# Patient Record
Sex: Male | Born: 1965 | Race: White | Hispanic: No | Marital: Single | Smoking: Current every day smoker
Health system: Southern US, Community
[De-identification: ages and names within clinical notes are randomized; demographics above are authoritative.]

## PROBLEM LIST (undated history)

## (undated) DIAGNOSIS — I10 Essential (primary) hypertension: Secondary | ICD-10-CM

## (undated) HISTORY — PX: HAND SURGERY: SHX662

---

## 2007-02-26 ENCOUNTER — Inpatient Hospital Stay (HOSPITAL_COMMUNITY): Admission: EM | Admit: 2007-02-26 | Discharge: 2007-02-27 | Payer: Self-pay | Admitting: Emergency Medicine

## 2007-02-26 ENCOUNTER — Ambulatory Visit: Payer: Self-pay | Admitting: Cardiology

## 2007-02-26 ENCOUNTER — Ambulatory Visit: Payer: Self-pay | Admitting: Internal Medicine

## 2008-03-22 IMAGING — CR DG CHEST 1V PORT
1 series · 1 of 1 positions shown · non-contrast
Comparison: None available.

CLINICAL DATA: 41-year-old with chest pain for three days.  
 PORTABLE CHEST - 1 VIEW 02/26/07:

[view not recorded]
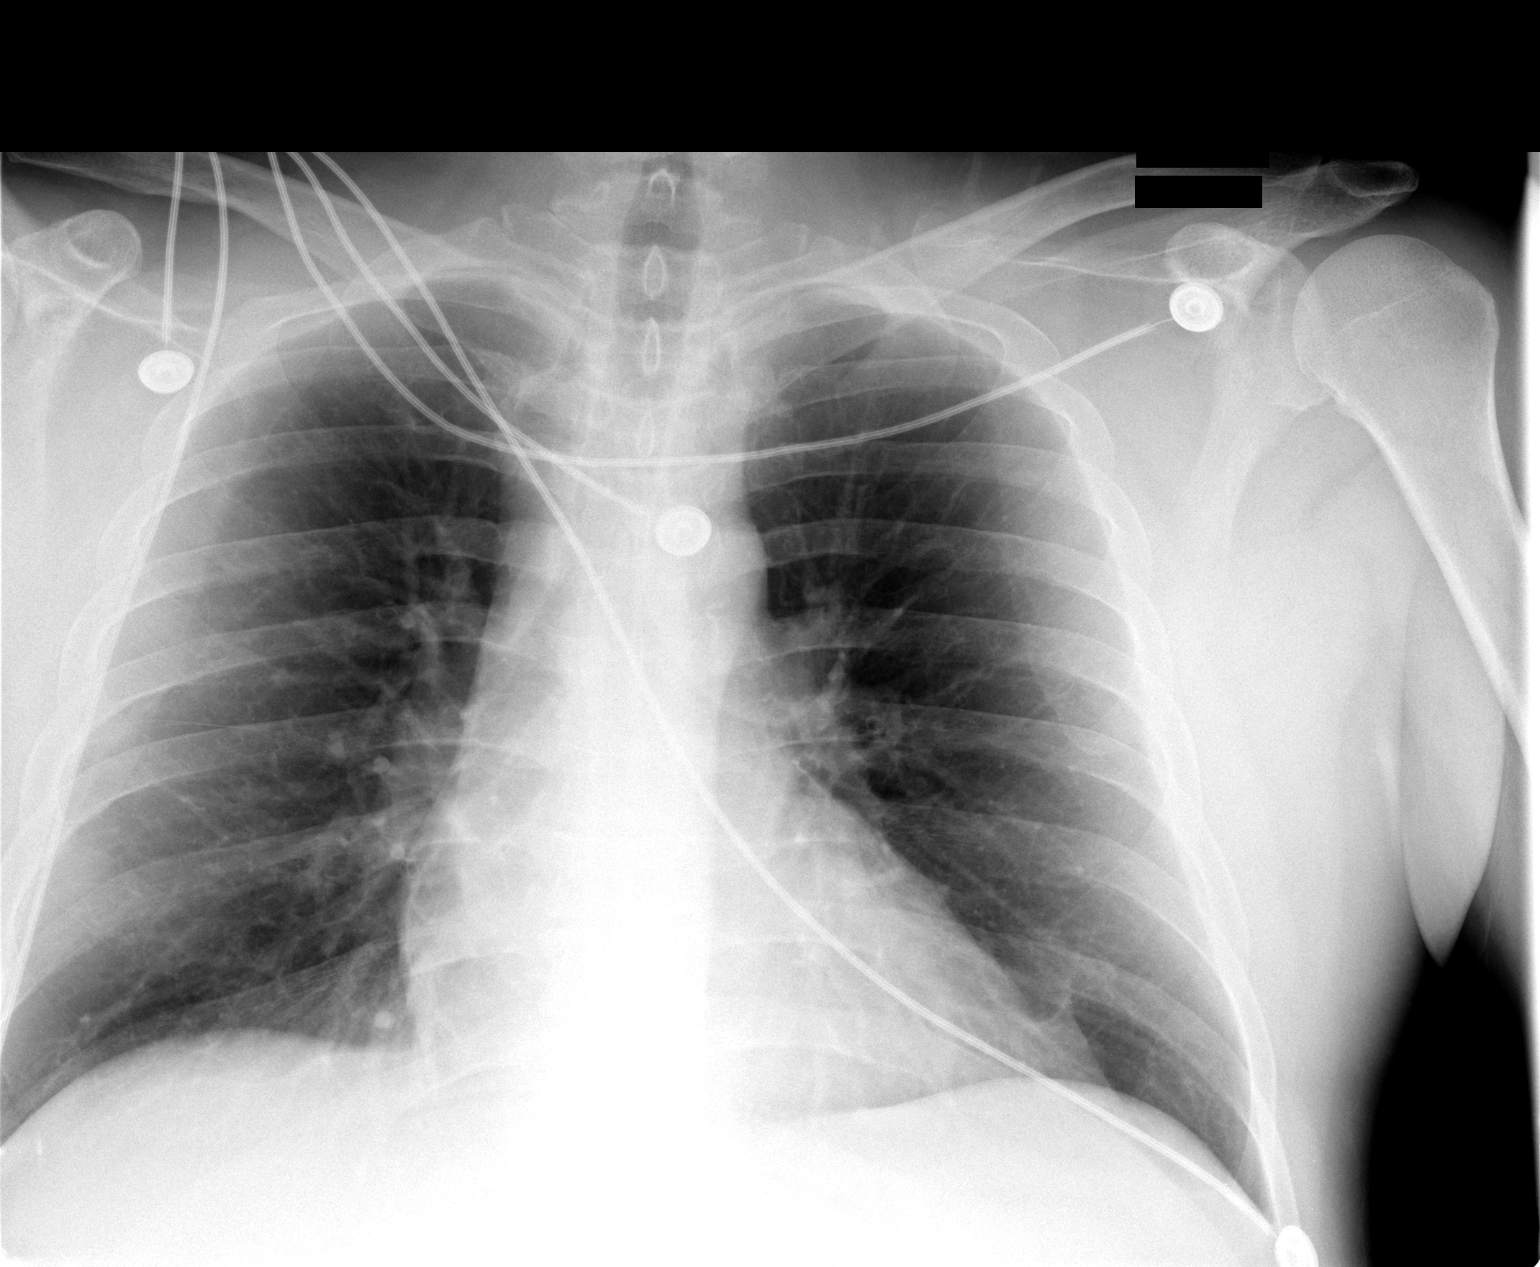

[1 of 1 positions shown; findings below may reference images not displayed]

FINDINGS: Heart size is normal.  Lungs are free of focal consolidations and pleural effusions.  There is minimal left base atelectasis.
IMPRESSION: No evidence for acute abnormality.

## 2010-03-05 ENCOUNTER — Ambulatory Visit (HOSPITAL_COMMUNITY): Admission: RE | Admit: 2010-03-05 | Discharge: 2010-03-05 | Payer: Self-pay | Admitting: Orthopedic Surgery

## 2010-10-02 LAB — DIFFERENTIAL
Basophils Relative: 0 % (ref 0–1)
Eosinophils Relative: 1 % (ref 0–5)
Monocytes Relative: 9 % (ref 3–12)
Neutro Abs: 3.6 10*3/uL (ref 1.7–7.7)
Neutrophils Relative %: 55 % (ref 43–77)

## 2010-10-02 LAB — COMPREHENSIVE METABOLIC PANEL
ALT: 66 U/L — ABNORMAL HIGH (ref 0–53)
AST: 84 U/L — ABNORMAL HIGH (ref 0–37)
Alkaline Phosphatase: 57 U/L (ref 39–117)
BUN: 7 mg/dL (ref 6–23)
Calcium: 9.2 mg/dL (ref 8.4–10.5)
Creatinine, Ser: 0.92 mg/dL (ref 0.4–1.5)
GFR calc Af Amer: >60 mL/min (ref >60)
GFR calc non Af Amer: >60 mL/min (ref >60)

## 2010-10-02 LAB — CBC
Hemoglobin: 14.9 g/dL (ref 13.0–17.0)
MCH: 32.1 pg (ref 26.0–34.0)
MCHC: 35.5 g/dL (ref 30.0–36.0)
RBC: 4.63 MIL/uL (ref 4.22–5.81)

## 2010-10-02 LAB — SURGICAL PCR SCREEN: Staphylococcus aureus: POSITIVE — AB

## 2010-12-01 NOTE — Consult Note (Signed)
NAME:  Gavin Martinez, Gavin Martinez                  ACCOUNT NO.:  [id number]   MEDICAL RECORD NO.:  [id number]          PATIENT TYPE:  INP   LOCATION:  1443                         FACILITY:  [facility]   PHYSICIAN:  Gavin Frieze. Jens Som, MD, FACCDATE OF BIRTH:  07-Oct-1965   DATE OF CONSULTATION:  02/27/2007  DATE OF DISCHARGE:                                 CONSULTATION   REASON FOR CONSULTATION:  Gavin Martinez is a 45 year old gentleman with no  significant past medical history who we are asked to evaluate for chest  pain.  The patient typically does not have dyspnea on exertion,  orthopnea, PND, pedal edema, palpitations, presyncope, syncope or  exertional chest pain.  He does have a significant tobacco and alcohol  history.  Thursday evening, he was playing cards.  He states he was  smoking quite a bit and drinking.  Friday morning, he woke and had a  pain in his chest.  The pain did not radiate.  There was no associated  nausea, vomiting, shortness of breath or diaphoresis.  The pain  increased with certain movements and also with deep inspiration.  His  pain persisted over the past 3 days and increased yesterday.  He was  admitted for rule out myocardial function by the primary service and  cardiology was asked to further evaluate.   MEDICATIONS:  1. Enoxaparin 40 mg subcutaneous daily.  2. Aspirin 81 mg daily.  3. Protonix 40 mg daily.  4. Nicotine patch.   ALLERGIES:  No known drug allergies.   FAMILY HISTORY:  Unknown as he is adopted.   SOCIAL HISTORY:  He smokes one pack of cigarettes per day.  He consumes  significant amounts of alcohol including 12 beers per day on the  weekends by his report.  He denies any cocaine use.   PAST MEDICAL HISTORY:  There is no diabetes mellitus, hypertension or  hyperlipidemia.  He has had no previous surgeries.   REVIEW OF SYSTEMS:  He denies any headaches or fevers or chills.  There  is no productive cough or hemoptysis.  There is no dysphagia,  odynophagia, melena or hematochezia.  There is no dysuria or hematuria.  There is no rash or seizure activity.  There is no orthopnea, PND or  pedal edema.  There is no claudication noted.  The remaining systems are  negative.   PHYSICAL EXAMINATION:  VITAL SIGNS:  Today, exam shows a blood pressure  of 136/99.  His pulse is 63.  He is afebrile.  He is 100% on room air.  GENERAL:  He is well-developed, well-nourished in no acute distress.  SKIN:  Warm and dry.  There is no peripheral clubbing.  BACK:  Normal.  HEENT:  Normal with normal eyelids.  NECK:  Supple with a normal upstroke bilaterally.  No bruits heard.  There is no jugular distension and I cannot appreciate thyromegaly.  CHEST:  Chest is clear to auscultation on expansion.  CARDIAC:  Regular rate and rhythm.  Normal S1, S2.  There are no  murmurs, rubs or gallops.  There is no tenderness to palpation  in the  chest area.  ABDOMEN:  Nontender and positive bowel sounds, no hepatosplenomegaly.  No masses appreciated.  There is no abdominal bruit.  He has 2+ femoral  pulses bilaterally.  No bruits.  EXTREMITIES:  No edema.  I could palpate no cords.  He has 2+ posterior  tibial pulses bilaterally.  NEUROLOGIC:  Grossly intact.   LABORATORY DATA AND X-RAY FINDINGS:  Electrocardiogram shows a sinus  rhythm at a rate of 71.  The axis is normal.  There are no ST changes  noted.  His chest x-ray shows no acute disease.   Total cholesterol 220 with triglyceride level of 417 and HDL 26.  Cardiac markers are negative.  His TSH is normal.  His SGOT is mildly  elevated at 50.  A D-dimer is normal.  His hemoglobin is 14.3.   IMPRESSION/RECOMMENDATIONS:  1. Atypical chest pain.  His symptoms are most consistent with      musculoskeletal pain.  He is ruled out for myocardial infarction      and serial enzymes are negative.  He can be discharged from a      cardiac standpoint if his echocardiogram is normal and I will      schedule an  exercise treadmill for risk stratification as he does      have risk factors.  2. Tobacco alcohol abuse.  I discussed the importance of discontinuing      this.  3. Elevated blood pressure.  We need to monitor his blood pressure and      if it remains elevated, he will require therapy.  4. Hyperlipidemia.  He needs therapy for his abnormal cholesterol and      I will leave this to the primary care physicians.  5. Increased SGOT.  Most likely secondary to his alcohol abuse.   Please call if there are any questions.      Gavin Frieze Jens Som, MD, [facility]  Electronically Signed     BSC/MEDQ  D:  02/27/2007  T:  02/28/2007  Job:  [id number]

## 2010-12-01 NOTE — H&P (Signed)
NAME:  Gavin Martinez, Gavin Martinez                  ACCOUNT NO.:  [id number]   MEDICAL RECORD NO.:  [id number]          PATIENT TYPE:  EMS   LOCATION:  ED                           FACILITY:  [facility]   PHYSICIAN:  Ladell Pier, M.D.   DATE OF BIRTH:  1966/03/07   DATE OF ADMISSION:  02/26/2007  DATE OF DISCHARGE:                              HISTORY & PHYSICAL   CHIEF COMPLAINT:  Chest pain.   HISTORY OF PRESENT ILLNESS:  The patient is a 45 year old white male  that presented to the emergency department with complaints of chest pain  for the past 3 days.  Pain is in the left side of the chest and radiates  across the chest to the left arm.  He also complains of the pain being  more like a pressure sensation, about a 7/10.  He also states that it  hurts to take a deep breath and when he coughs.  He has had no  respiratory symptoms.  He woke up this morning with the pain.  It has  been going on for the past 3 days.  He has had no unusual physical  activities.  The pain is not related to fluid.  He has no nausea,  vomiting.  No history of reflux.   PAST MEDICAL HISTORY:  None.   FAMILY HISTORY:  Unknown.  He is adopted.   SOCIAL HISTORY:  He is single.  No children.  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  He smokes about a pack of cigarettes per day.  He  drinks several beers every day with dinner.  No IV drug use.   MEDICATIONS:  None.   ALLERGIES:  None.   REVIEW OF SYSTEMS:  As per stated in HPI.   PHYSICAL EXAM:  Temperature 98.7, blood pressure 127/84, pulse 93,  respirations 16, oxygen saturation 96% on room air.  HEENT: Normocephalic, atraumatic.  Pupils reactive to light, without  erythema.  CARDIOVASCULAR:  Regular rate and rhythm.  LUNGS:  Clear bilaterally.  ABDOMEN:  Positive bowel sounds  EXTREMITIES:  Without edema.   LABS:  WBC 7.0, hemoglobin 14.3, platelets 217, MCV 9.7.  Sodium 137,  potassium 4.2, chloride 102, CO2 25,, BUN 8, creatinine 0.78, glucose  84,  D-dimer less than the 0.22.  Chest x-ray is with atelectasis,  otherwise normal.  Cardiac enzymes are normal.  EKG normal.   ASSESSMENT/PLAN:  1. Atypical chest pain:  Unsure of the etiology.  One component sounds      cardiac, the other sounds pleuritic.  We will admit the patient to      the hospital, cycle enzymes, check TSH and lipid panel to further      assess risk stratification.  We will also get a 2-D echo and we      will set him up for stress test prior to leaving if all labs are      normal.  If abnormal labs then will schedule him for cardiology      consult inpatient with stress test.  No sign of  endocarditis and no rubs.  EKG looks fine.  It does not look like      dissection since it is not radiating to his back, but we will check      his blood pressure in both arms.  D-dimer is normal for PE.  2. Tobacco use.  Will give nicotine patch and counsel the patient.      Ladell Pier, M.D.  Electronically Signed     NJ/MEDQ  D:  02/26/2007  T:  02/27/2007  Job:  [id number]

## 2010-12-01 NOTE — Discharge Summary (Signed)
NAME:  Gavin Martinez, Gavin Martinez                  ACCOUNT NO.:  [id number]   MEDICAL RECORD NO.:  [id number]          PATIENT TYPE:  INP   LOCATION:  1443                         [facility]ACILITY[facility]   PHYSICIAN:  Ladell Pier, M.D.   DATE OF BIRTH:  Jun 15, 1966   DATE OF ADMISSION:  02/26/2007  DATE OF DISCHARGE:  02/27/2007                               DISCHARGE SUMMARY   DISCHARGE DIAGNOSES:  1. Atypical chest pain.  2. Dyslipidemia.  3. Hypertension.  4. Tobacco use.  5. Alcohol use.   DISCHARGE MEDICATIONS:  1. Lotensin 40 mg daily.  2. Lipitor 20 mg half q.h.s.   FOLLOW-UP APPOINTMENTS:  The patient is to follow up with her primary  care doctor in 1 week.   CONSULTANTS:  Cardiology, Madolyn Frieze. Jens Som, MD.  The patient is  scheduled for outpatient stress test March 29, 2007 at 11:30 a.m.   HISTORY OF PRESENT ILLNESS:  The patient is a 45 year old white male who  presented to the emergency department and complains of chest pain for  the past 3 days.  The pain is in the left side of the chest, radiates  across the chest to the left arm.  He also complains of pain being more  like a pressure sensation about a 7 out of a 10.  He also states that it  hurts to take a deep breath and when he coughs.  He has had no more  respiratory symptoms.  He woke up this morning with the pain; it has  been going on for the past 3 days.  He has had no unusual physical  activity.  The pain is not related to fluid. He has no nausea or  vomiting;  no history of reflux.   PAST MEDICAL HISTORY FAMILY HISTORY SOCIAL HISTORY MEDS ALLERGIES:  Listed in emergency.   REVIEW OF SYSTEMS:  Per admission H&P.   PHYSICAL EXAMINATION ON DISCHARGE:  Temperature 98, pulse 53,  respirations 18, blood pressure 136/99, pulse 100% on room air.  HEENT:  Normocephalic, atraumatic.  Pupils equal, round and reactive to light  without erythema.  CARDIOVASCULAR:  Regular rate and rhythm.  LUNGS: Clear bilaterally.  ABDOMEN:  Positive bowel sounds without edema.   HOSPITAL COURSE:  1. CHEST PAIN:  The patient was admitted  Cardiac enzymes were cycled;      they were all negative.  D-dimer negative.  Chest X-Ray: Negative.      The 2-D echo did not show any wall motion abnormalities.  The      patient was discharged home, to follow up with cardiology in      September for a stress test.  We also started him on medications      for his cholesterol and his blood pressure.  Could be      musculoskeletal, or could be GI related.  So, started the patient      on proton pump inhibitor and an anti-inflammatory.  2. HYPERTENSION:  The patient started on Lotensin.  He was told to      follow up with primary care physician  in 1 week in order to get his      blood pressure rechecked, and to evaluate his kidney function for      his blood pressure medication.  3. DYSLIPIDEMIA:  The patient started on Lipitor.  He was told not to      drink with the medication and was told to follow up with his      primary care physician to get his liver function checked.  4. TOBACCO AND ALCOHOL.  The patient consulted on alcohol cessation      and smoking cessation.   DISCHARGE LABS:  Cardiac enzymes negative.  Lipid panel:  Total  cholesterol 220, triglycerides 417, HDL 26, LDL not calculated secondary  to high triglycerides.  TSH 1.050.  Bilirubin 1.5.  Alkaline phosphatase  57 ALS  53, ALT 50.  D-dimer less than 0.22.  A 2-D echocardiogram:  Overall left ventricular systolic function was normal.  Left ventricular  EF was estimated to be 65%.  Left ventricular wall thickness was mildly  increased.   ACTIVE DISCHARGE MEDICATIONS:  1. Prilosec OTC daily.  2. Ibuprofen 800 mg b.i.d. p.r.n.      Ladell Pier, M.D.  Electronically Signed     NJ/MEDQ  D:  02/27/2007  T:  02/28/2007  Job:  [id number]   cc:   Madolyn Frieze. Jens Som, MD, [facility]  [address]  [address]  [place]  [place] [address]

## 2011-05-03 LAB — BASIC METABOLIC PANEL
CO2: 25
Calcium: 8.9
Creatinine, Ser: 0.78
GFR calc Af Amer: >60
GFR calc non Af Amer: >60
Glucose, Bld: 84
Potassium: 4.2
Sodium: 137

## 2011-05-03 LAB — CBC
MCHC: 35.3
MCV: 89.7
Platelets: 217
RDW: 12.9
WBC: 7

## 2011-05-03 LAB — HEPATIC FUNCTION PANEL
ALT: 50
Bilirubin, Direct: 0.2

## 2011-05-03 LAB — CARDIAC PANEL(CRET KIN+CKTOT+MB+TROPI)
CK, MB: 0.9
CK, MB: 1
Relative Index: 0.9
Relative Index: INVALID
Total CK: 98
Troponin I: 0.02
Troponin I: 0.02

## 2011-05-03 LAB — COMPREHENSIVE METABOLIC PANEL
Alkaline Phosphatase: 58
BUN: 12
CO2: 28
Creatinine, Ser: 0.9
GFR calc Af Amer: >60
GFR calc non Af Amer: >60
Potassium: 3.8
Sodium: 140
Total Protein: 7.4

## 2011-05-03 LAB — DIFFERENTIAL
Basophils Absolute: 0
Lymphs Abs: 2.4
Monocytes Absolute: 0.6
Monocytes Relative: 9

## 2011-05-03 LAB — POCT CARDIAC MARKERS
CKMB, poc: 1.4
CKMB, poc: <1 — ABNORMAL LOW
Myoglobin, poc: 48.5
Myoglobin, poc: 68.1
Operator id: 1211
Troponin i, poc: <0.05
Troponin i, poc: <0.05

## 2011-05-03 LAB — PROTIME-INR: INR: 1

## 2011-05-03 LAB — LIPID PANEL: HDL: 26 — ABNORMAL LOW

## 2011-05-03 LAB — TSH: TSH: 1.05

## 2011-05-03 LAB — TROPONIN I: Troponin I: 0.02

## 2011-05-03 LAB — CK TOTAL AND CKMB (NOT AT ARMC): Total CK: 155

## 2021-03-31 ENCOUNTER — Other Ambulatory Visit: Payer: Self-pay

## 2021-03-31 ENCOUNTER — Encounter (HOSPITAL_BASED_OUTPATIENT_CLINIC_OR_DEPARTMENT_OTHER): Payer: Self-pay

## 2021-03-31 ENCOUNTER — Emergency Department (HOSPITAL_BASED_OUTPATIENT_CLINIC_OR_DEPARTMENT_OTHER): Payer: No Typology Code available for payment source

## 2021-03-31 ENCOUNTER — Emergency Department (HOSPITAL_BASED_OUTPATIENT_CLINIC_OR_DEPARTMENT_OTHER)
Admission: EM | Admit: 2021-03-31 | Discharge: 2021-03-31 | Disposition: A | Discharge status: Home / Self Care | Payer: No Typology Code available for payment source | Attending: Emergency Medicine | Admitting: Emergency Medicine

## 2021-03-31 DIAGNOSIS — M79602 Pain in left arm: Secondary | ICD-10-CM | POA: Insufficient documentation

## 2021-03-31 DIAGNOSIS — Z79899 Other long term (current) drug therapy: Secondary | ICD-10-CM | POA: Insufficient documentation

## 2021-03-31 DIAGNOSIS — M546 Pain in thoracic spine: Secondary | ICD-10-CM | POA: Diagnosis not present

## 2021-03-31 DIAGNOSIS — F17210 Nicotine dependence, cigarettes, uncomplicated: Secondary | ICD-10-CM | POA: Insufficient documentation

## 2021-03-31 DIAGNOSIS — I10 Essential (primary) hypertension: Secondary | ICD-10-CM | POA: Insufficient documentation

## 2021-03-31 HISTORY — DX: Essential (primary) hypertension: I10

## 2021-03-31 LAB — CBC
HCT: 43.8 % (ref 39.0–52.0)
Hemoglobin: 14.9 g/dL (ref 13.0–17.0)
MCH: 30.2 pg (ref 26.0–34.0)
MCHC: 34 g/dL (ref 30.0–36.0)
MCV: 88.7 fL (ref 80.0–100.0)
Platelets: 202 10*3/uL (ref 150–400)
RBC: 4.94 MIL/uL (ref 4.22–5.81)
RDW: 13.2 % (ref 11.5–15.5)
WBC: 8.4 10*3/uL (ref 4.0–10.5)
nRBC: 0 % (ref 0.0–0.2)

## 2021-03-31 LAB — TROPONIN I (HIGH SENSITIVITY)
Troponin I (High Sensitivity): 7 ng/L (ref <18)
Troponin I (High Sensitivity): 7 ng/L (ref <18)

## 2021-03-31 LAB — BASIC METABOLIC PANEL
Anion gap: 8 (ref 5–15)
BUN: 15 mg/dL (ref 6–20)
CO2: 26 mmol/L (ref 22–32)
Calcium: 9 mg/dL (ref 8.9–10.3)
Chloride: 102 mmol/L (ref 98–111)
Creatinine, Ser: 0.69 mg/dL (ref 0.61–1.24)
GFR, Estimated: >60 mL/min (ref >60)
Glucose, Bld: 105 mg/dL — ABNORMAL HIGH (ref 70–99)
Potassium: 3.8 mmol/L (ref 3.5–5.1)
Sodium: 136 mmol/L (ref 135–145)

## 2021-03-31 MED ORDER — DEXLANSOPRAZOLE 60 MG PO CPDR: 60.0000 mg | DELAYED_RELEASE_CAPSULE | Freq: Every day | ORAL | 0 refills | Status: AC

## 2021-03-31 MED ORDER — LIDOCAINE 5 % EX PTCH: 1.0000 | MEDICATED_PATCH | CUTANEOUS | 0 refills | Status: AC

## 2021-03-31 MED ORDER — LISINOPRIL 20 MG PO TABS: 20.0000 mg | ORAL_TABLET | Freq: Every day | ORAL | 0 refills | Status: AC

## 2021-03-31 MED ORDER — METHOCARBAMOL 500 MG PO TABS: 500.0000 mg | ORAL_TABLET | Freq: Every evening | ORAL | 0 refills | Status: AC | PRN

## 2021-03-31 NOTE — ED Triage Notes (Addendum)
Pt c/o intermittent left side CP/left arm pain x 2 weeks-states pain worse last night-NAD-steady gait-later added that he had been out of BP meds at least 1 month

## 2021-03-31 NOTE — ED Provider Notes (Signed)
[facility] EMERGENCY DEPARTMENT Provider Note   CSN: [id number] Arrival date & time: 03/31/21  1111     History Chief Complaint  Patient presents with   Chest Pain    Gavin Martinez is a 55 y.o. male presenting for evaluation of left back and arm pain.  Patient states of the past 2 weeks he has had pain in his left back, shoots down his left arm.  Occasionally comes around to his chest.  Pain is worse with specific movements, mostly when he leans back.  Pain is worsening, which is what prompted him to come to the ER.  Pain is not worse with inspiration.  Not worse with movement of the arm.  He denies fall, trauma, or injury.  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  No other PE risk factors.  He denies fevers, chills, cough, nausea, vomiting, sweatiness, clamminess, history of heart problems.  He does have a history of high blood pressure, is out of his medication.  He has taken Aleve without improvement symptoms, has not tried anything else  HPI     Past Medical History:  Diagnosis Date   Hypertension     There are no problems to display for this patient.    No family history on file.  Social History   Tobacco Use   Smoking status: Every Day    Types: Cigarettes   Smokeless tobacco: Never  Vaping Use   Vaping Use: Never used  Substance Use Topics   Alcohol use: Yes    Comment: daily   Drug use: Never    Home Medications Prior to Admission medications   Medication Sig Start Date End Date Taking? Authorizing Provider  dexlansoprazole (DEXILANT) 60 MG capsule Take 1 capsule (60 mg total) by mouth daily. 03/31/21  Yes Geronimo Diliberto, PA-C  lidocaine (LIDODERM) 5 % Place 1 patch onto the skin daily. Remove & Discard patch within 12 hours or as directed by MD 03/31/21  Yes Mikalah Skyles, PA-C  lisinopril (ZESTRIL) 20 MG tablet Take 1 tablet (20 mg total) by mouth daily. 03/31/21  Yes Tyce Delcid, PA-C   methocarbamol (ROBAXIN) 500 MG tablet Take 1 tablet (500 mg total) by mouth at bedtime as needed for muscle spasms. 03/31/21  Yes Kacie Huxtable, PA-C    Allergies    Patient has no known allergies.  Review of Systems   Review of Systems  Musculoskeletal:  Positive for back pain.  All other systems reviewed and are negative.  Physical Exam Updated Vital Signs BP (!) 151/106 (BP Location: Right Arm)   Pulse 88   Temp 98.4 F (36.9 C) (Oral)   Resp 20   Ht 6\' 1"  (1.854 m)   Wt 100.2 kg   SpO2 100%   BMI 29.16 kg/m   Physical Exam Vitals and nursing note reviewed.  Constitutional:      General: He is not in acute distress.    Appearance: Normal appearance.     Comments: Resting in the chair no acute distress  HENT:     Head: Normocephalic and atraumatic.  Eyes:     Conjunctiva/sclera: Conjunctivae normal.     Pupils: Pupils are equal, round, and reactive to light.  Cardiovascular:     Rate and Rhythm: Normal rate and regular rhythm.     Pulses: Normal pulses.  Pulmonary:     Effort: Pulmonary effort is normal. No respiratory distress.     Breath sounds:  Normal breath sounds. No wheezing.     Comments: Speaking in full sentences.  Clear lung sounds in all fields. Abdominal:     General: There is no distension.     Palpations: Abdomen is soft. There is no mass.     Tenderness: There is no abdominal tenderness. There is no guarding or rebound.  Musculoskeletal:        General: Normal range of motion.     Cervical back: Normal range of motion and neck supple.       Back:     Right lower leg: No edema.     Left lower leg: No edema.     Comments: Focal tenderness palpation of the left mid thoracic back.  When palpated, patient reports pain shoots down his arm.  No tenderness palpation of midline spine.  No step-offs or deformities.  Full active range of motion of both upper extremities.  Grip strength equal bilaterally.  Skin:    General: Skin is warm and dry.      Capillary Refill: Capillary refill takes less than 2 seconds.  Neurological:     Mental Status: He is alert and oriented to person, place, and time.  Psychiatric:        Mood and Affect: Mood and affect normal.        Speech: Speech normal.        Behavior: Behavior normal.    ED Results / Procedures / Treatments   Labs (all labs ordered are listed, but only abnormal results are displayed) Labs Reviewed  BASIC METABOLIC PANEL - Abnormal; Notable for the following components:      Result Value   Glucose, Bld 105 (*)    All other components within normal limits  CBC  TROPONIN I (HIGH SENSITIVITY)  TROPONIN I (HIGH SENSITIVITY)    EKG EKG Interpretation  Date/Time:  Tuesday March 31 2021 11:21:21 EDT Ventricular Rate:  98 PR Interval:  134 QRS Duration: 84 QT Interval:  338 QTC Calculation: 431 R Axis:   42 Text Interpretation: Normal sinus rhythm Normal ECG NSR, similar to previous Confirmed by Coralee Pesa [phone]) on 03/31/2021 11:23:51 AM  Radiology DG Chest 2 View  Result Date: 03/31/2021 CLINICAL DATA:  Chest pain. EXAM: CHEST - 2 VIEW COMPARISON:  03/05/2010 FINDINGS: Both lungs are clear. Atherosclerotic calcifications at the aortic arch. Heart and mediastinum are within normal limits. Degenerative changes in thoracic spine. No large pleural effusions. Negative for a pneumothorax. IMPRESSION: No active cardiopulmonary disease. Electronically Signed   By: Richarda Overlie M.D.   On: 03/31/2021 11:42    Procedures Procedures   Medications Ordered in ED Medications - No data to display  ED Course  I have reviewed the triage vital signs and the nursing notes.  Pertinent labs & imaging results that were available during my care of the patient were reviewed by me and considered in my medical decision making (see chart for details).    MDM Rules/Calculators/A&P                           Patient presenting for evaluation of back pain that radiates into his left arm.   On exam, patient peers nontoxic.  Pain is reproducible with palpation of the mid thoracic back muscles.  Labs obtained from triage interpreted by me, overall reassuring.  Troponins negative x2.  EKG unchanged from previous, is nonischemic.  Chest x-ray viewed and independently interpreted by me, no pneumonia, effusion.  Discussed findings with patient.  Discussed likely MSK cause.  Discussed treatment with anti-inflammatories and muscle relaxers.  Encourage follow-up with PCP.  Additionally, refill patient's BP and GERD medication.  At this time, patient appears safe for discharge.  Return precautions given.  Patient states he understands and agrees to plan.  Final Clinical Impression(s) / ED Diagnoses Final diagnoses:  Acute left-sided thoracic back pain    Rx / DC Orders ED Discharge Orders          Ordered    methocarbamol (ROBAXIN) 500 MG tablet  At bedtime PRN        03/31/21 1525    dexlansoprazole (DEXILANT) 60 MG capsule  Daily        03/31/21 1525    lisinopril (ZESTRIL) 20 MG tablet  Daily        03/31/21 1525    lidocaine (LIDODERM) 5 %  Every 24 hours        03/31/21 1525             [place], [place], PA-C 03/31/21 1622    Pollyann Savoy, MD 03/31/21 Windell Moment

## 2021-03-31 NOTE — ED Notes (Signed)
ED Provider at triage for d/c

## 2021-03-31 NOTE — Discharge Instructions (Addendum)
It is very important that you schedule an appointment with your primary care doctor for further refills of your medications as well as further evaluation of your back.  Take aleve 2 times a day with meals.  Do not take other anti-inflammatories at the same time (Advil, Motrin, ibuprofen). You may supplement with Tylenol if you need further pain control. Use Robaxin as needed for muscle stiffness or soreness. Have caution, as this may make you tired or groggy. Do not drive or operate heavy machinery while taking this medication.  Use the numbing patch as needed for pain.  Follow up with your primary care doctor if pain is not improving with this treatment in 2 weeks.  Return to the ER if you develop high fevers, numbness, loss of bowel or bladder control, or any new or concerning symptoms.

## 2022-04-25 IMAGING — DX DG CHEST 2V
2 series · 2 of 2 positions shown · non-contrast
Comparison: 03/05/2010

CLINICAL DATA: Chest pain.

EXAM:
CHEST - 2 VIEW

[chest pa]
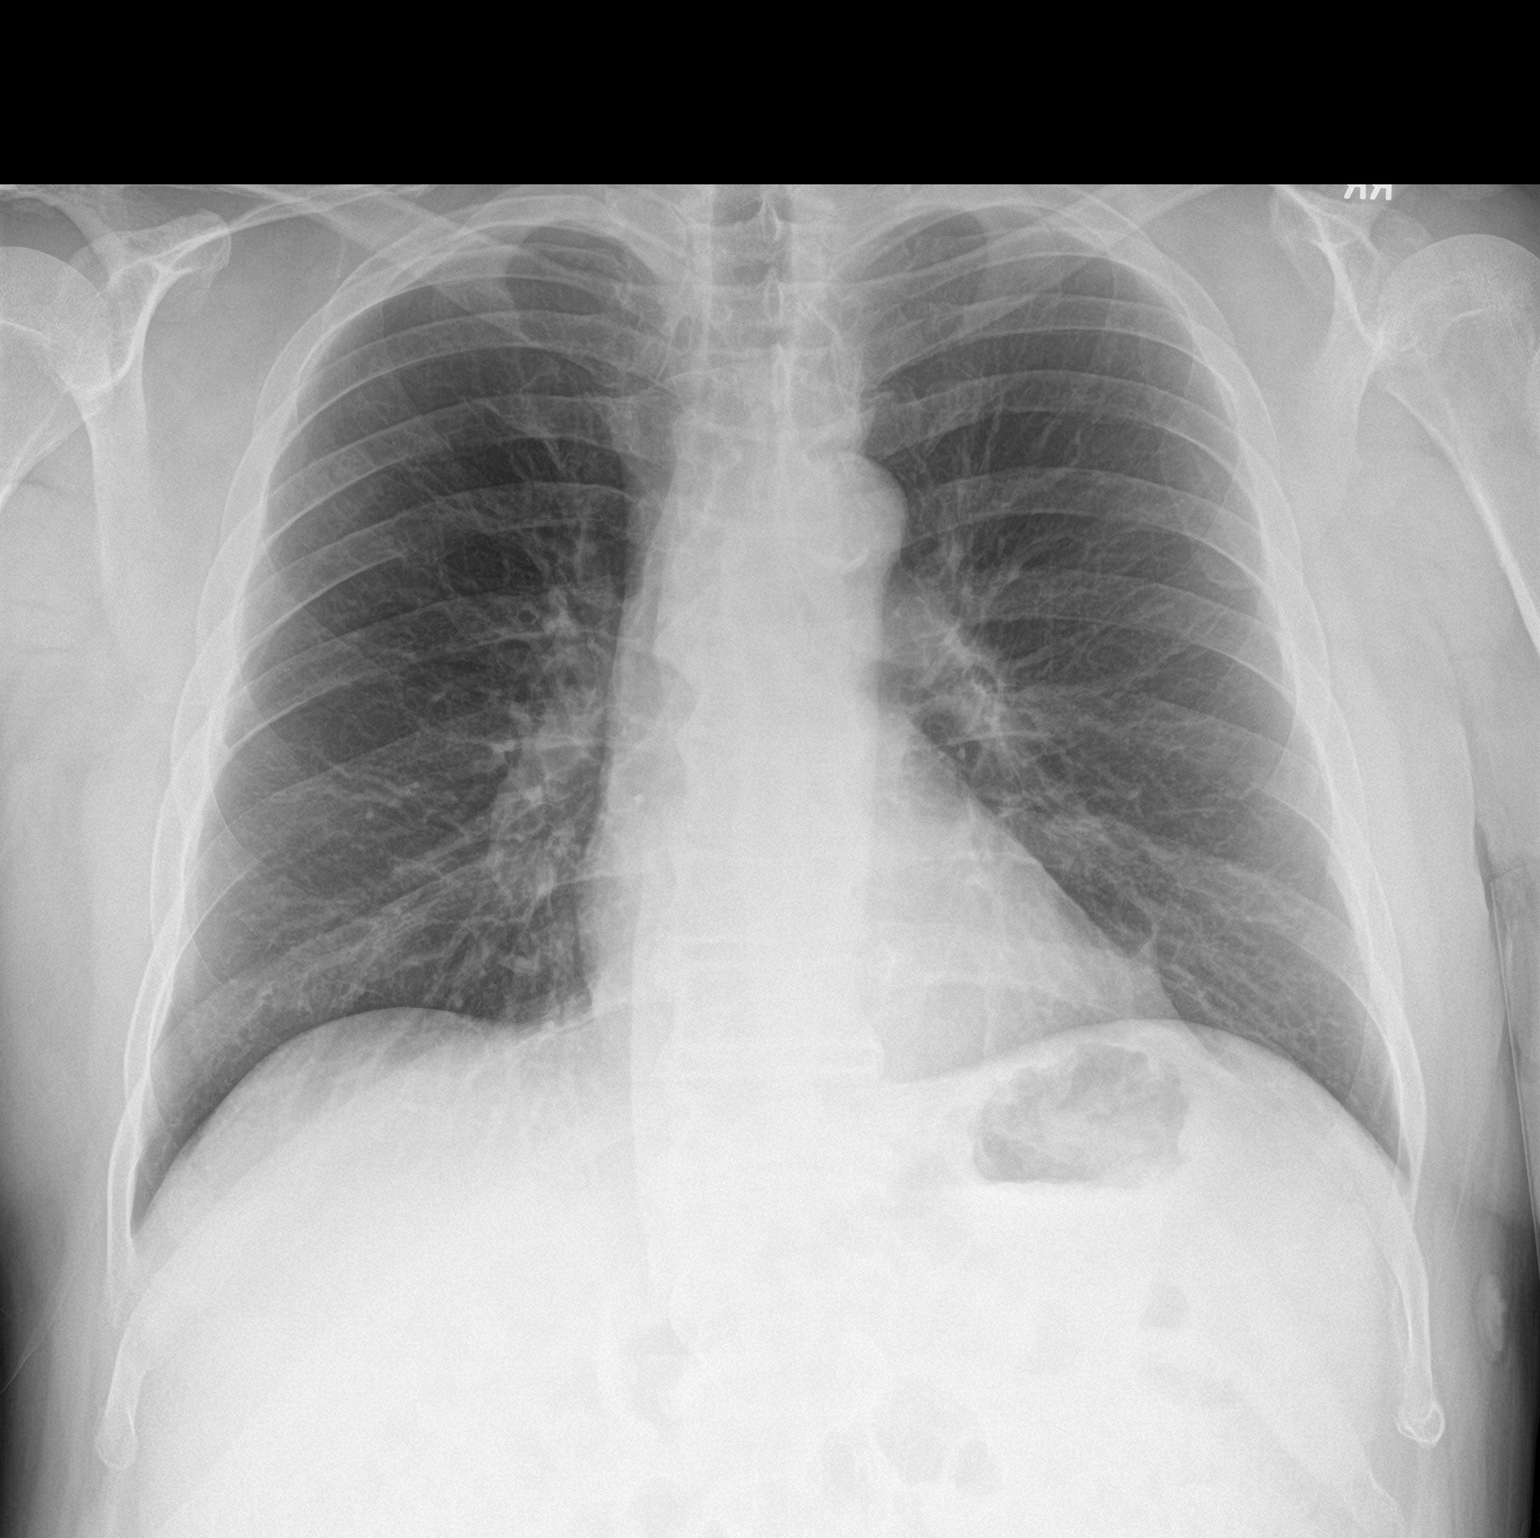

[chest lat]
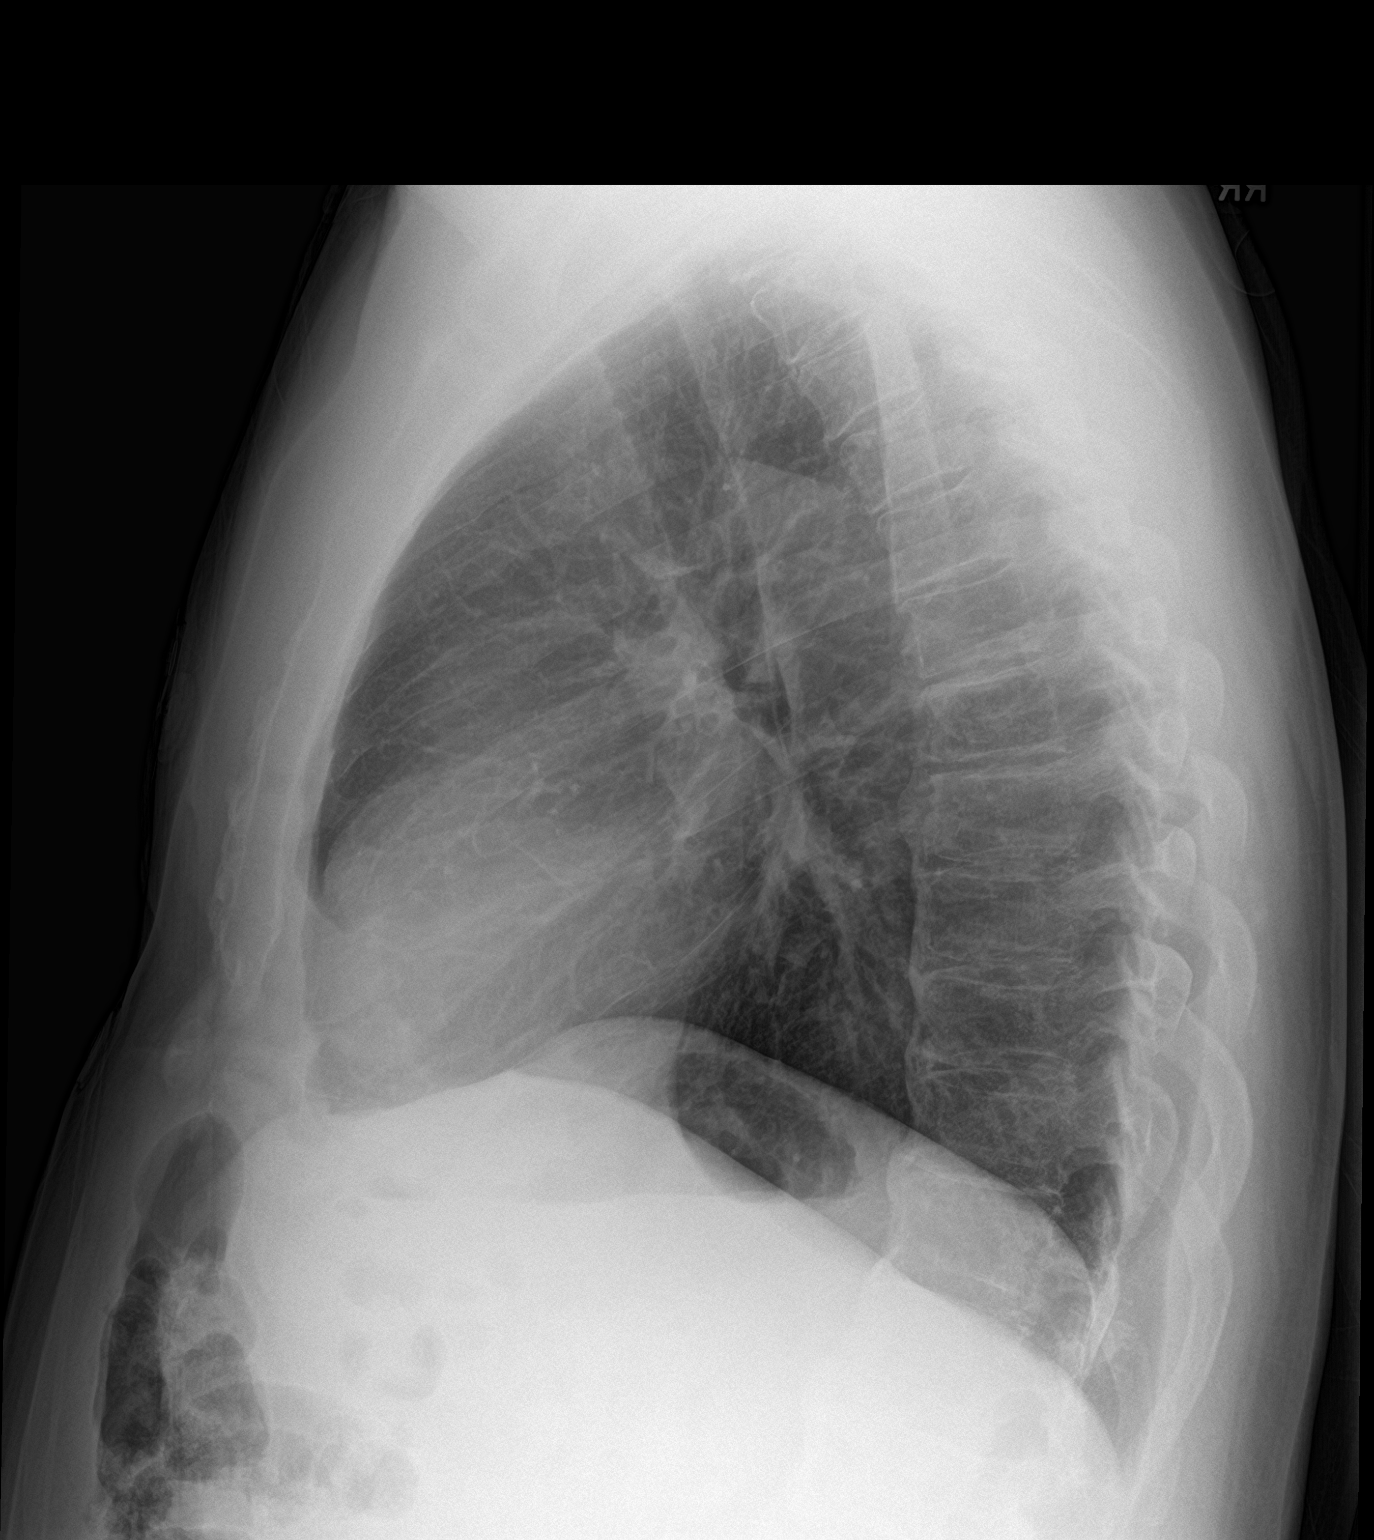

[2 of 2 positions shown; findings below may reference images not displayed]

FINDINGS: Both lungs are clear. Atherosclerotic calcifications at the aortic
arch. Heart and mediastinum are within normal limits. Degenerative
changes in thoracic spine. No large pleural effusions. Negative for
a pneumothorax.
IMPRESSION: No active cardiopulmonary disease.

## 2024-06-18 DEATH — deceased
